# Patient Record
Sex: Male | Born: 1975 | Race: White | Hispanic: No | Marital: Married | Smoking: Never smoker
Health system: Southern US, Community
[De-identification: ages and names within clinical notes are randomized; demographics above are authoritative.]

## PROBLEM LIST (undated history)

## (undated) ENCOUNTER — Emergency Department (HOSPITAL_COMMUNITY): Discharge status: Absent without leave | Payer: Self-pay

## (undated) HISTORY — PX: UPPER GI ENDOSCOPY: SHX6162

## (undated) HISTORY — PX: LEG SURGERY: SHX1003

## (undated) HISTORY — PX: COLONOSCOPY: SHX174

## (undated) HISTORY — PX: WISDOM TOOTH EXTRACTION: SHX21

---

## 2002-01-29 ENCOUNTER — Emergency Department (HOSPITAL_COMMUNITY): Admission: EM | Admit: 2002-01-29 | Discharge: 2002-01-30 | Payer: Self-pay | Admitting: Emergency Medicine

## 2002-01-30 ENCOUNTER — Encounter: Payer: Self-pay | Admitting: Emergency Medicine

## 2005-01-25 ENCOUNTER — Emergency Department (HOSPITAL_COMMUNITY): Admission: EM | Admit: 2005-01-25 | Discharge: 2005-01-25 | Payer: Self-pay | Admitting: Emergency Medicine

## 2009-10-07 ENCOUNTER — Emergency Department (HOSPITAL_COMMUNITY): Admission: EM | Admit: 2009-10-07 | Discharge: 2009-10-07 | Payer: Self-pay | Admitting: Family Medicine
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
## 2010-10-03 IMAGING — CR DG SHOULDER 2+V*R*
3 series · 3 of 3 positions shown · non-contrast
Comparison: None.

CLINICAL DATA: Shoulder pain status post fall.

RIGHT SHOULDER - 2+ VIEW

[view not recorded (1 of 3)]
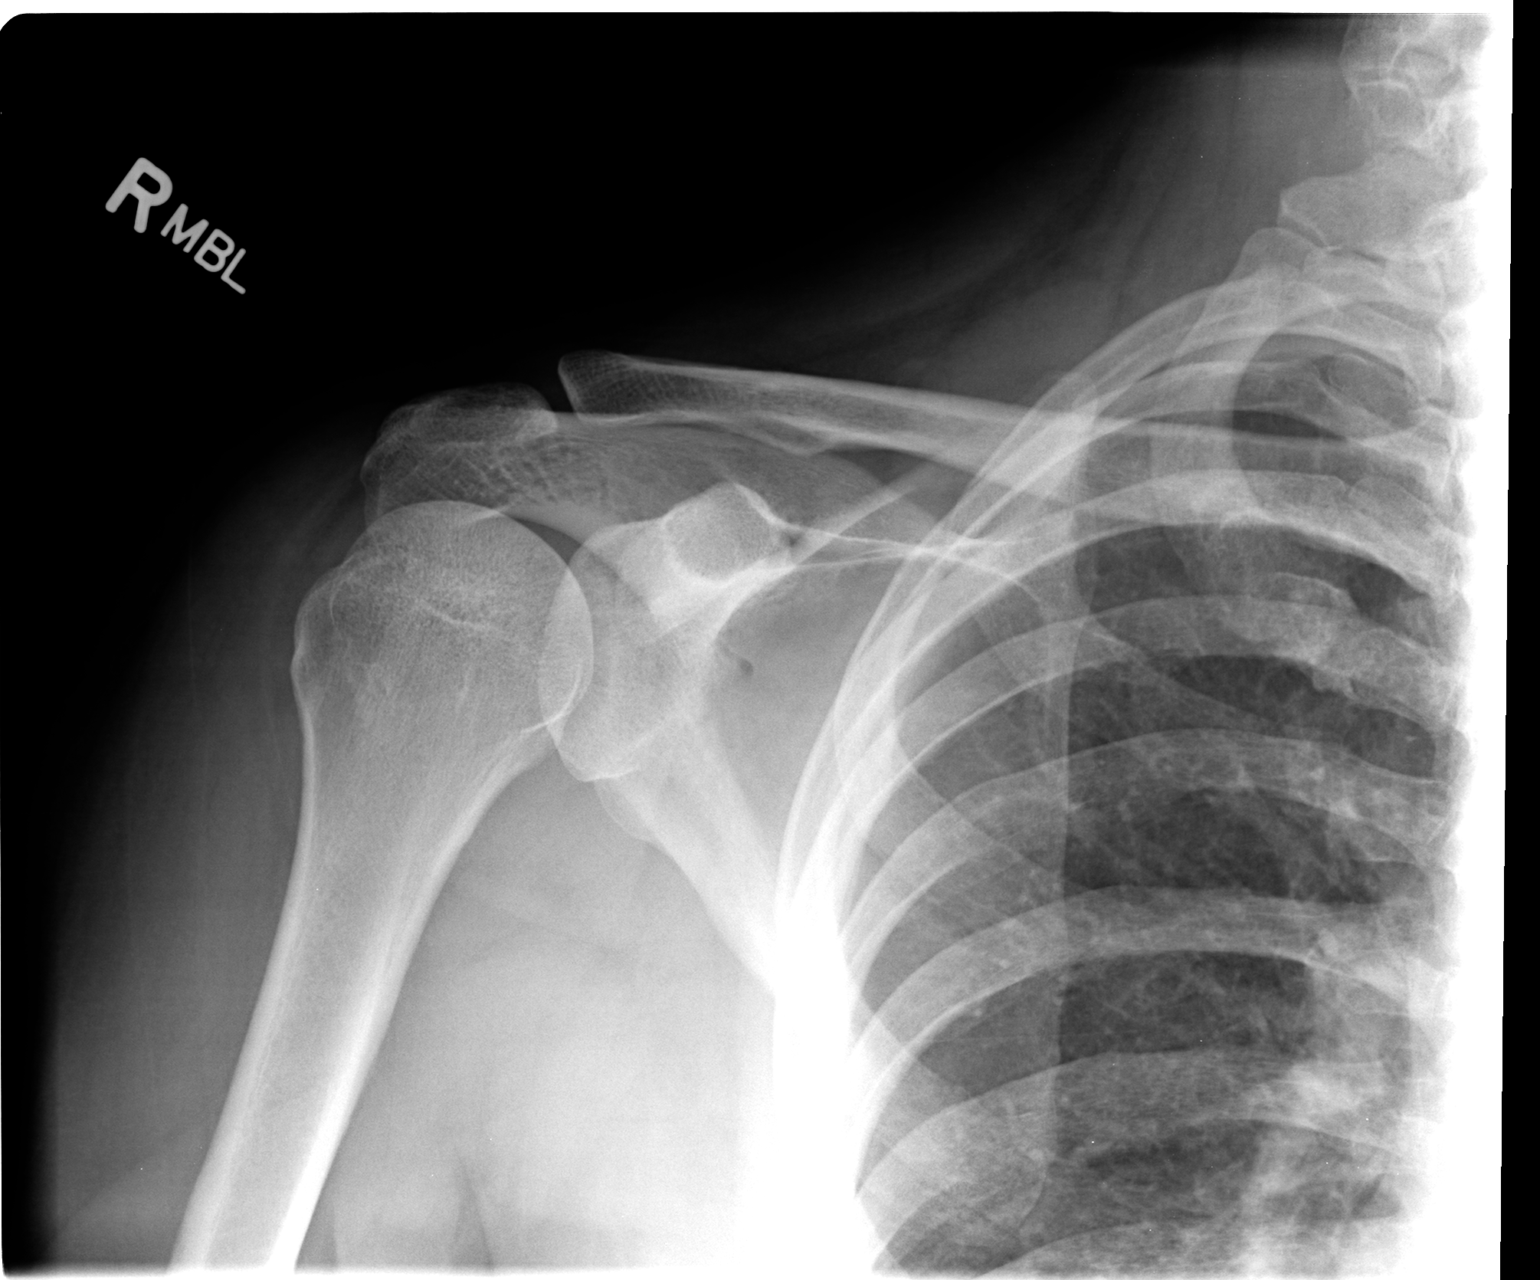

[view not recorded (2 of 3)]
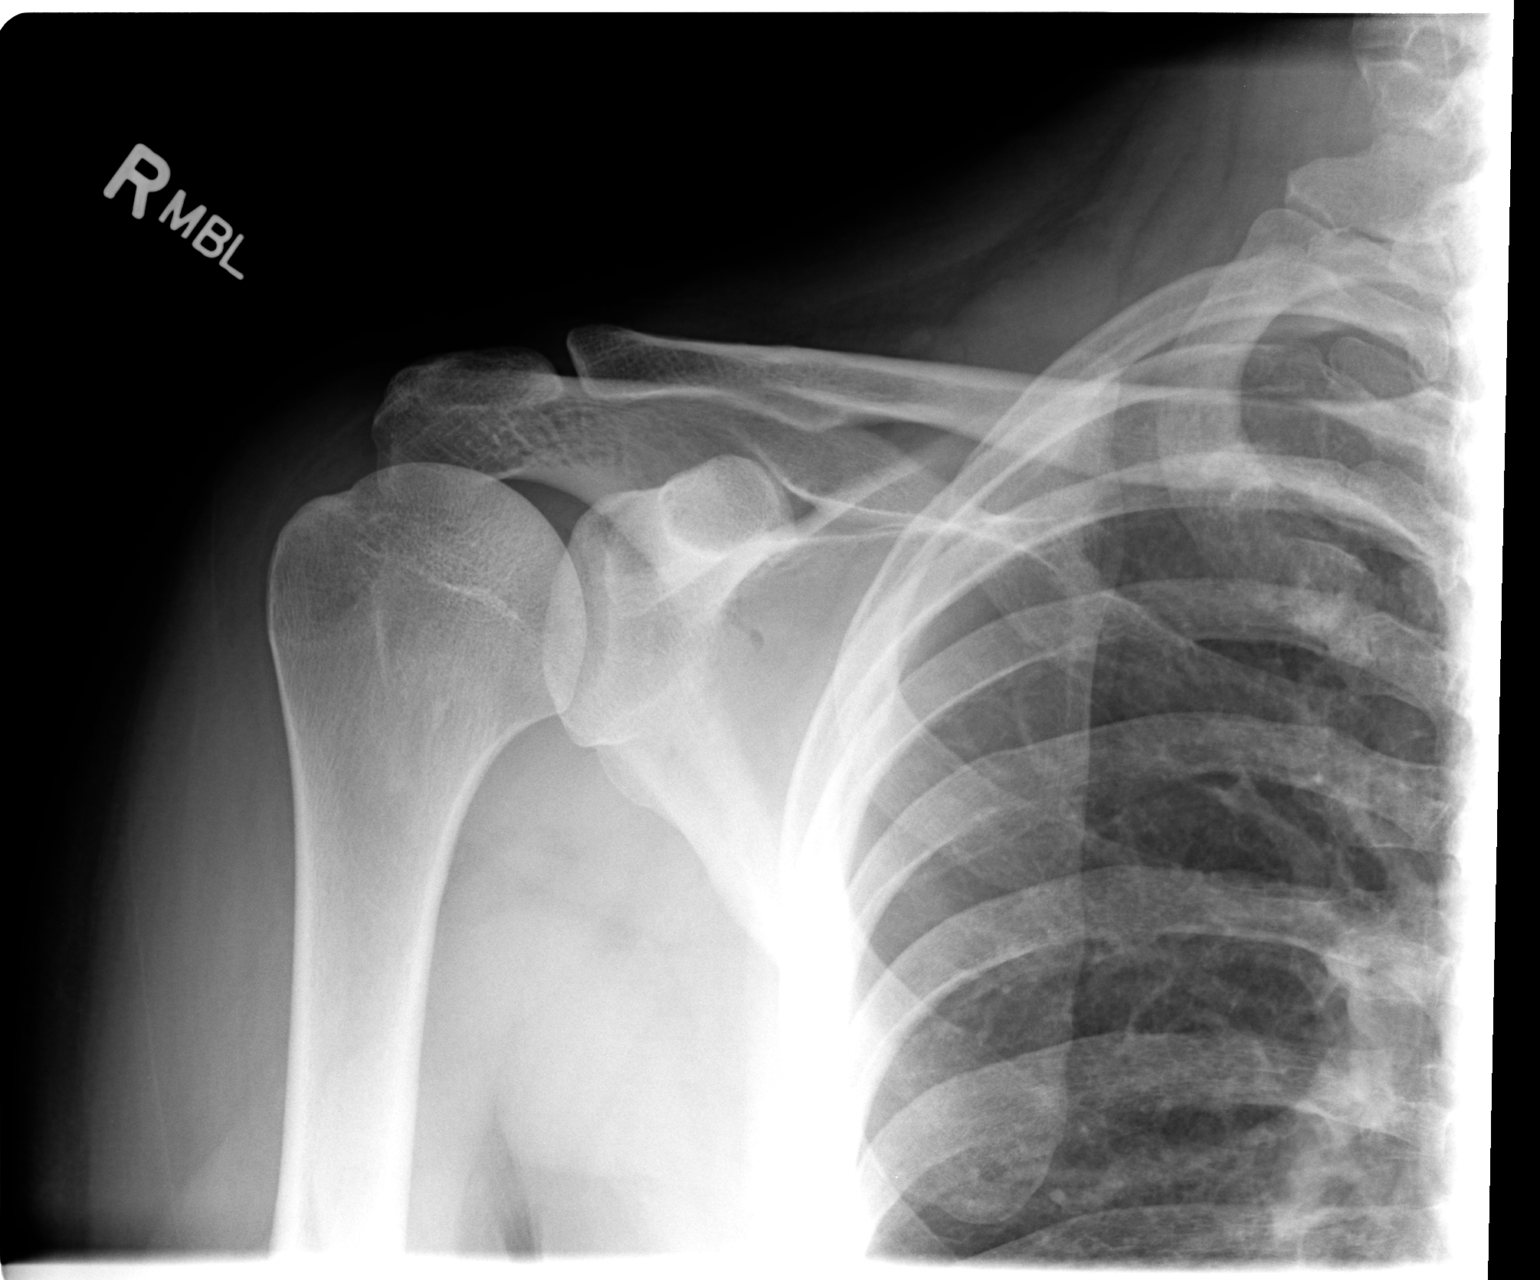

[view not recorded (3 of 3)]
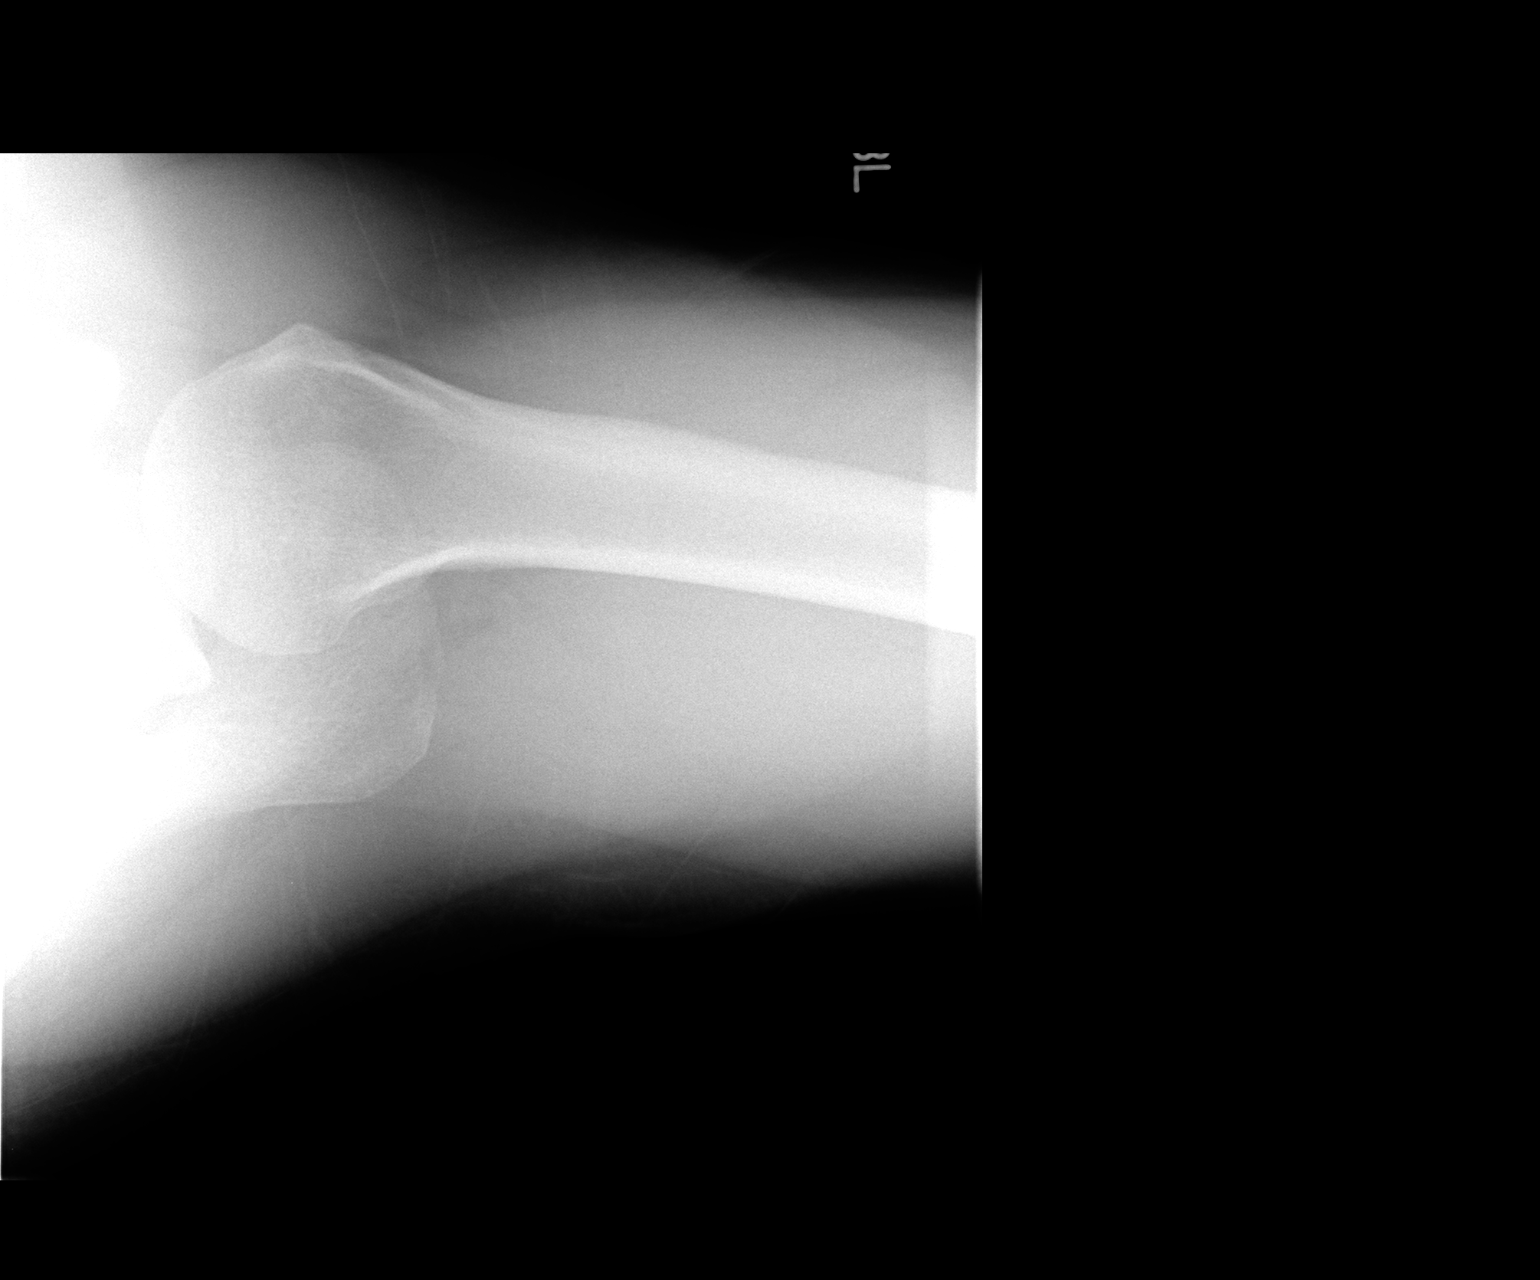

[3 of 3 positions shown; findings below may reference images not displayed]

FINDINGS: Mineralization and alignment are normal.  There is no
evidence of acute fracture or dislocation.  The subacromial space
is preserved.
IMPRESSION: Normal examination.

## 2014-09-28 ENCOUNTER — Encounter (HOSPITAL_COMMUNITY): Payer: Self-pay | Admitting: Emergency Medicine

## 2014-09-28 ENCOUNTER — Emergency Department (HOSPITAL_COMMUNITY)
Admission: EM | Admit: 2014-09-28 | Discharge: 2014-09-28 | Disposition: A | Discharge status: Home / Self Care | Payer: Worker's Compensation | Attending: Emergency Medicine | Admitting: Emergency Medicine

## 2014-09-28 DIAGNOSIS — Y9289 Other specified places as the place of occurrence of the external cause: Secondary | ICD-10-CM | POA: Diagnosis not present

## 2014-09-28 DIAGNOSIS — Y9389 Activity, other specified: Secondary | ICD-10-CM | POA: Diagnosis not present

## 2014-09-28 DIAGNOSIS — S199XXA Unspecified injury of neck, initial encounter: Secondary | ICD-10-CM | POA: Insufficient documentation

## 2014-09-28 DIAGNOSIS — S0003XA Contusion of scalp, initial encounter: Secondary | ICD-10-CM | POA: Insufficient documentation

## 2014-09-28 DIAGNOSIS — Y998 Other external cause status: Secondary | ICD-10-CM | POA: Diagnosis not present

## 2014-09-28 DIAGNOSIS — S0990XA Unspecified injury of head, initial encounter: Secondary | ICD-10-CM | POA: Diagnosis present

## 2014-09-28 MED ORDER — IBUPROFEN 800 MG PO TABS
800.0000 mg | ORAL_TABLET | Freq: Once | ORAL | Status: AC
  Administered 2014-09-28: 800 mg via ORAL
  Filled 2014-09-28: qty 1

## 2014-09-28 MED ORDER — HYDROCODONE-ACETAMINOPHEN 5-325 MG PO TABS: 1.0000 | ORAL_TABLET | ORAL | Status: AC | PRN

## 2014-09-28 MED ORDER — IBUPROFEN 800 MG PO TABS: 800.0000 mg | ORAL_TABLET | Freq: Three times a day (TID) | ORAL | Status: AC
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
